# Patient Record
Sex: Female | Born: 1970 | Race: Black or African American | Hispanic: No | Smoking: Never smoker
Health system: Southern US, Community
[De-identification: ages and names within clinical notes are randomized; demographics above are authoritative.]

---

## 2014-03-07 HISTORY — PX: VAGINAL HYSTERECTOMY: SUR661

## 2020-10-01 ENCOUNTER — Other Ambulatory Visit (HOSPITAL_COMMUNITY)
Admission: RE | Admit: 2020-10-01 | Discharge: 2020-10-01 | Disposition: A | Discharge status: Home / Self Care | Payer: BC Managed Care – PPO | Source: Ambulatory Visit | Attending: Obstetrics | Admitting: Obstetrics

## 2020-10-01 ENCOUNTER — Ambulatory Visit (INDEPENDENT_AMBULATORY_CARE_PROVIDER_SITE_OTHER): Payer: BC Managed Care – PPO | Admitting: Obstetrics

## 2020-10-01 ENCOUNTER — Encounter: Payer: Self-pay | Admitting: Obstetrics

## 2020-10-01 ENCOUNTER — Other Ambulatory Visit: Payer: Self-pay

## 2020-10-01 VITALS — BP 152/96 | HR 73 | Ht 61.0 in | Wt 131.7 lb

## 2020-10-01 DIAGNOSIS — N898 Other specified noninflammatory disorders of vagina: Secondary | ICD-10-CM

## 2020-10-01 DIAGNOSIS — B373 Candidiasis of vulva and vagina: Secondary | ICD-10-CM | POA: Diagnosis not present

## 2020-10-01 DIAGNOSIS — B3731 Acute candidiasis of vulva and vagina: Secondary | ICD-10-CM

## 2020-10-01 DIAGNOSIS — Z01419 Encounter for gynecological examination (general) (routine) without abnormal findings: Secondary | ICD-10-CM

## 2020-10-01 DIAGNOSIS — Z780 Asymptomatic menopausal state: Secondary | ICD-10-CM

## 2020-10-01 MED ORDER — CLOTRIMAZOLE 1 % EX CREA: 1.0000 "application " | TOPICAL_CREAM | Freq: Two times a day (BID) | CUTANEOUS | 1 refills | Status: DC

## 2020-10-01 NOTE — Progress Notes (Signed)
Subjective:        Shannon Beck is a 50 y.o. female here for a routine exam.  Current complaints: Itching in right groin area and vaginal discharge..    Personal health questionnaire:  Is patient Shannon Beck, have a family history of breast and/or ovarian cancer: no Is there a family history of uterine cancer diagnosed at age < 70, gastrointestinal cancer, urinary tract cancer, family member who is a Personnel officer syndrome-associated carrier: no Is the patient overweight and hypertensive, family history of diabetes, personal history of gestational diabetes, preeclampsia or PCOS: no Is patient over 15, have PCOS,  family history of premature CHD under age 45, diabetes, smoke, have hypertension or peripheral artery disease:  no At any time, has a partner hit, kicked or otherwise hurt or frightened you?: no Over the past 2 weeks, have you felt down, depressed or hopeless?: no Over the past 2 weeks, have you felt little interest or pleasure in doing things?:no   Gynecologic History No LMP recorded. Patient is postmenopausal. Contraception: post menopausal status Last Pap: 2016. Results were: normal Last mammogram: 2022. Results were: normal  Obstetric History OB History  Gravida Para Term Preterm AB Living  3 3 3     3   SAB IAB Ectopic Multiple Live Births          3    # Outcome Date GA Lbr Len/2nd Weight Sex Delivery Anes PTL Lv  3 Term      Vag-Spont     2 Term      Vag-Spont     1 Term      Vag-Spont       History reviewed. No pertinent past medical history.  Past Surgical History:  Procedure Laterality Date   VAGINAL HYSTERECTOMY  2016   Has ovaries     Current Outpatient Medications:    Cholecalciferol (D3-1000) 25 MCG (1000 UT) capsule, , Disp: , Rfl:    clotrimazole (LOTRIMIN) 1 % cream, Apply 1 application topically 2 (two) times daily., Disp: 30 g, Rfl: 1   ferrous sulfate 325 (65 FE) MG tablet, Take by mouth., Disp: , Rfl:  No Known Allergies  Social History    Tobacco Use   Smoking status: Never   Smokeless tobacco: Never  Substance Use Topics   Alcohol use: Yes    Comment: 1-2x's year    Family History  Problem Relation Age of Onset   Hypertension Mother    Heart murmur Mother    Diabetes Father       Review of Systems  Constitutional: negative for fatigue and weight loss Respiratory: negative for cough and wheezing Cardiovascular: negative for chest pain, fatigue and palpitations Gastrointestinal: negative for abdominal pain and change in bowel habits Musculoskeletal:negative for myalgias Neurological: negative for gait problems and tremors Behavioral/Psych: negative for abusive relationship, depression Endocrine: negative for temperature intolerance    Genitourinary: positive for vaginal discharge and itching in right groin area.  negative for abnormal menstrual periods, genital lesions, hot flashes, sexual problems  Integument/breast: negative for breast lump, breast tenderness, nipple discharge and skin lesion(s)    Objective:       BP (!) 152/96   Pulse 73   Ht 5\' 1"  (1.549 m)   Wt 131 lb 11.2 oz (59.7 kg)   BMI 24.88 kg/m  General:   Alert and no distress  Skin:   no rash or abnormalities  Lungs:   clear to auscultation bilaterally  Heart:   regular rate and  rhythm, S1, S2 normal, no murmur, click, rub or gallop  Breasts:   normal without suspicious masses, skin or nipple changes or axillary nodes  Abdomen:  normal findings: no organomegaly, soft, non-tender and no hernia  Pelvis:  External genitalia: normal general appearance Urinary system: urethral meatus normal and bladder without fullness, nontender Vaginal: normal without tenderness, induration or masses Cervix: absent Adnexa: normal bimanual exam Uterus: absent   Lab Review Urine pregnancy test Labs reviewed yes Radiologic studies reviewed yes  I have spent a total of 20 minutes of face-to-face time, excluding clinical staff time, reviewing notes  and preparing to see patient, ordering tests and/or medications, and counseling the patient.   Assessment:   1. Encounter for annual routine gynecological examination  2. Menopause - doing well.  Occasional hot flashes.  3. Vaginal discharge Rx: - Cervicovaginal ancillary only  4. Candidal vulvitis Rx: - clotrimazole (LOTRIMIN) 1 % cream; Apply 1 application topically 2 (two) times daily.  Dispense: 30 g; Refill: 1     Plan:    Education reviewed: calcium supplements, depression evaluation, low fat, low cholesterol diet, safe sex/STD prevention, self breast exams, and weight bearing exercise. Follow up in: 1 year.   Meds ordered this encounter  Medications   clotrimazole (LOTRIMIN) 1 % cream    Sig: Apply 1 application topically 2 (two) times daily.    Dispense:  30 g    Refill:  1     Brock Bad, MD 10/01/2020 10:56 AM

## 2020-10-01 NOTE — Progress Notes (Signed)
NGYN patient presents for annual. Mammogram completed Jan 2022. Pt c/o outer vaginal discomfort for months.

## 2020-10-05 ENCOUNTER — Other Ambulatory Visit: Payer: Self-pay | Admitting: Obstetrics

## 2020-10-05 DIAGNOSIS — N760 Acute vaginitis: Secondary | ICD-10-CM

## 2020-10-05 LAB — CERVICOVAGINAL ANCILLARY ONLY
Bacterial Vaginitis (gardnerella): POSITIVE — AB
Candida Glabrata: NEGATIVE
Candida Vaginitis: NEGATIVE
Comment: NEGATIVE
Comment: NEGATIVE
Comment: NEGATIVE
Comment: NEGATIVE
Trichomonas: NEGATIVE

## 2020-10-05 MED ORDER — METRONIDAZOLE 500 MG PO TABS
500.0000 mg | ORAL_TABLET | Freq: Two times a day (BID) | ORAL | 2 refills | Status: DC
Start: 2020-10-05 — End: 2021-01-13

## 2020-10-06 ENCOUNTER — Telehealth: Payer: Self-pay

## 2020-10-06 NOTE — Telephone Encounter (Signed)
-----   Message from Brock Bad, MD sent at 10/05/2020  1:10 PM EDT ----- Flagyl Rx for BV

## 2020-10-06 NOTE — Telephone Encounter (Signed)
-----   Message from Charles A Harper, MD sent at 10/05/2020  1:10 PM EDT ----- Flagyl Rx for BV 

## 2020-10-06 NOTE — Telephone Encounter (Signed)
Patient reviewed test results online.RX has been called into pharmacy.

## 2021-01-13 ENCOUNTER — Other Ambulatory Visit: Payer: Self-pay

## 2021-01-13 ENCOUNTER — Encounter: Payer: Self-pay | Admitting: Obstetrics

## 2021-01-13 ENCOUNTER — Ambulatory Visit: Payer: BC Managed Care – PPO | Admitting: Obstetrics

## 2021-01-13 VITALS — BP 161/84 | HR 64 | Ht 61.0 in | Wt 129.9 lb

## 2021-01-13 DIAGNOSIS — Z780 Asymptomatic menopausal state: Secondary | ICD-10-CM

## 2021-01-13 DIAGNOSIS — Z01419 Encounter for gynecological examination (general) (routine) without abnormal findings: Secondary | ICD-10-CM

## 2021-01-13 DIAGNOSIS — N750 Cyst of Bartholin's gland: Secondary | ICD-10-CM

## 2021-01-13 MED ORDER — CLINDAMYCIN HCL 300 MG PO CAPS
300.0000 mg | ORAL_CAPSULE | Freq: Three times a day (TID) | ORAL | 0 refills | Status: DC
Start: 2021-01-13 — End: 2021-02-03

## 2021-01-13 NOTE — Progress Notes (Signed)
"  Cyst" in vaginal area, mildly painful, no vag discharge, had hysterectomy (ovaries only still present)

## 2021-01-13 NOTE — Progress Notes (Signed)
Patient ID: Shannon Beck, female   DOB: 09-29-1970, 50 y.o.   MRN: [id number]  No chief complaint on file.   HPI Shannon Beck is a 50 y.o. female.  Complains of " cyst " on outside of vagina that came up a week ago, nontender, small. HPI  No past medical history on file.  Past Surgical History:  Procedure Laterality Date   VAGINAL HYSTERECTOMY  2016   Has ovaries    Family History  Problem Relation Age of Onset   Hypertension Mother    Heart murmur Mother    Diabetes Father     Social History Social History   Tobacco Use   Smoking status: Never   Smokeless tobacco: Never  Substance Use Topics   Alcohol use: Yes    Comment: 1-2x's year   Drug use: Yes    No Known Allergies  Current Outpatient Medications  Medication Sig Dispense Refill   Cholecalciferol (D3-1000) 25 MCG (1000 UT) capsule      ferrous sulfate 325 (65 FE) MG tablet Take by mouth.     clotrimazole (LOTRIMIN) 1 % cream Apply 1 application topically 2 (two) times daily. 30 g 1   metroNIDAZOLE (FLAGYL) 500 MG tablet Take 1 tablet (500 mg total) by mouth 2 (two) times daily. 14 tablet 2   No current facility-administered medications for this visit.    Review of Systems Review of Systems Constitutional: negative for fatigue and weight loss Respiratory: negative for cough and wheezing Cardiovascular: negative for chest pain, fatigue and palpitations Gastrointestinal: negative for abdominal pain and change in bowel habits Genitourinary: positive for right Bartholin Cyst Integument/breast: negative for nipple discharge Musculoskeletal:negative for myalgias Neurological: negative for gait problems and tremors Behavioral/Psych: negative for abusive relationship, depression Endocrine: negative for temperature intolerance      Blood pressure (!) 161/84, pulse 64, height 5\' 1"  (1.549 m), weight 129 lb 14.4 oz (58.9 kg).  Physical Exam Physical Exam General:   Alert and no distress  Skin:   no rash  or abnormalities  Lungs:   clear to auscultation bilaterally  Heart:   regular rate and rhythm, S1, S2 normal, no murmur, click, rub or gallop  Breasts:   normal without suspicious masses, skin or nipple changes or axillary nodes  Abdomen:  normal findings: no organomegaly, soft, non-tender and no hernia  Pelvis:  External genitalia: Bartholin Cyst,ight, small, mild tenderness Urinary system: urethral meatus normal and bladder without fullness, nontender Vaginal: normal without tenderness, induration or masses Cervix: absent Adnexa: normal bimanual exam Uterus: absent      Data Reviewed Wet Prep  Assessment     1. Cyst of right Bartholin's gland, mildly tender, small Rx: - clindamycin (CLEOCIN) 300 MG capsule; Take 1 capsule (300 mg total) by mouth 3 (three) times daily.  Dispense: 21 capsule; Refill: 0  2. Menopause - clinically stable with occasional hot flashes     Plan   Follow up in 3 weeks   , MD 01/13/2021 4:16 PM

## 2021-02-03 ENCOUNTER — Telehealth (INDEPENDENT_AMBULATORY_CARE_PROVIDER_SITE_OTHER): Payer: BC Managed Care – PPO | Admitting: Obstetrics

## 2021-02-03 DIAGNOSIS — N750 Cyst of Bartholin's gland: Secondary | ICD-10-CM

## 2021-02-03 DIAGNOSIS — Z780 Asymptomatic menopausal state: Secondary | ICD-10-CM | POA: Diagnosis not present

## 2021-02-03 MED ORDER — AMOXICILLIN-POT CLAVULANATE 875-125 MG PO TABS: 1.0000 | ORAL_TABLET | Freq: Two times a day (BID) | ORAL | 0 refills | Status: DC

## 2021-02-03 NOTE — Progress Notes (Signed)
    GYNECOLOGY VIRTUAL VISIT ENCOUNTER NOTE  Provider location: Center for [facility]   Patient location: Home  I connected with Shannon Beck on 02/03/21 at  4:10 PM EST by MyChart Video Encounter and verified that I am speaking with the correct person using two identifiers.   I discussed the limitations, risks, security and privacy concerns of performing an evaluation and management service virtually and the availability of in person appointments. I also discussed with the patient that there may be a patient responsible charge related to this service. The patient expressed understanding and agreed to proceed.   History:  Shannon Beck is a 50 y.o. [id number] female being evaluated today for follow up after treatment of small, mildly symptomatic Bartholin Cyst. She denies any abnormal vaginal discharge, bleeding, pelvic pain or other concerns.       No past medical history on file. Past Surgical History:  Procedure Laterality Date   VAGINAL HYSTERECTOMY  2016   Has ovaries   The following portions of the patient's history were reviewed and updated as appropriate: allergies, current medications, past family history, past medical history, past social history, past surgical history and problem list.   Health Maintenance:  H/O a TVH.  Last pap smear in 2016, prior to hysterectomy.  Normal mammogram in 2022.   Review of Systems:  Pertinent items noted in HPI and remainder of comprehensive ROS otherwise negative.  Physical Exam:   General:  Alert, oriented and cooperative. Patient appears to be in no acute distress.  Mental Status: Normal mood and affect. Normal behavior. Normal judgment and thought content.   Respiratory: Normal respiratory effort, no problems with respiration noted  Rest of physical exam deferred due to type of encounter  Labs and Imaging  10/01/20 10:49  Trichomonas Negative  Candida Vaginitis Negative  Candida Glabrata Negative  Bacterial  Vaginitis (gardnerella) Positive !  CERVICOVAGINAL ANCILLARY ONLY Rpt !  !: Data is abnormal Rpt: View report in Results Review for more information    Assessment and Plan:     1. Cyst of right Bartholin's gland, resolving Rx: - amoxicillin-clavulanate (AUGMENTIN) 875-125 MG tablet; Take 1 tablet by mouth 2 (two) times daily. 1 tablet po BID x10 days  Dispense: 14 tablet; Refill: 0  2. Menopause - doing well clinically       I discussed the assessment and treatment plan with the patient. The patient was provided an opportunity to ask questions and all were answered. The patient agreed with the plan and demonstrated an understanding of the instructions.   The patient was advised to call back or seek an in-person evaluation/go to the ED if the symptoms worsen or if the condition fails to improve as anticipated.  I have spent a total of 15 minutes of non-face-to-face time, excluding clinical staff time, reviewing notes and preparing to see patient, ordering tests and/or medications, and counseling the patient.    Coral Ceo, MD Center for [facility], [facility]  02/03/21

## 2021-02-04 ENCOUNTER — Encounter: Payer: Self-pay | Admitting: Obstetrics

## 2021-02-25 ENCOUNTER — Encounter: Payer: Self-pay | Admitting: Obstetrics

## 2021-05-07 ENCOUNTER — Ambulatory Visit: Payer: BC Managed Care – PPO | Admitting: Obstetrics

## 2021-05-07 ENCOUNTER — Encounter: Payer: Self-pay | Admitting: Obstetrics

## 2021-05-07 ENCOUNTER — Other Ambulatory Visit: Payer: Self-pay

## 2021-05-07 VITALS — BP 127/73 | HR 77 | Ht 61.0 in | Wt 128.0 lb

## 2021-05-07 DIAGNOSIS — N750 Cyst of Bartholin's gland: Secondary | ICD-10-CM | POA: Diagnosis not present

## 2021-05-07 DIAGNOSIS — L738 Other specified follicular disorders: Secondary | ICD-10-CM

## 2021-05-07 NOTE — Progress Notes (Signed)
Patient ID: Shannon Beck, female   DOB: 1970-09-06, 51 y.o.   MRN: [id number] ? ?Chief Complaint  ?Patient presents with  ? Follow-up  ? ? ?HPI ?Shannon Beck is a 51 y.o. female.  Complains of painful area in right vulva area superior to the area of the Bartholin Cyst.  She states that that Bartholin Cyst has resolved. ?HPI ? ?History reviewed. No pertinent past medical history. ? ?Past Surgical History:  ?Procedure Laterality Date  ? VAGINAL HYSTERECTOMY  2016  ? Has ovaries  ? ? ?Family History  ?Problem Relation Age of Onset  ? Hypertension Mother   ? Heart murmur Mother   ? Diabetes Father   ? ? ?Social History ?Social History  ? ?Tobacco Use  ? Smoking status: Never  ? Smokeless tobacco: Never  ?Substance Use Topics  ? Alcohol use: Yes  ?  Comment: 1-2x's year  ? Drug use: Yes  ? ? ?No Known Allergies ? ?Current Outpatient Medications  ?Medication Sig Dispense Refill  ? amoxicillin-clavulanate (AUGMENTIN) 875-125 MG tablet Take 1 tablet by mouth 2 (two) times daily. 1 tablet po BID x10 days 14 tablet 0  ? Cholecalciferol (D3-1000) 25 MCG (1000 UT) capsule     ? clotrimazole (LOTRIMIN) 1 % cream Apply 1 application topically 2 (two) times daily. 30 g 1  ? ferrous sulfate 325 (65 FE) MG tablet Take by mouth.    ? ?No current facility-administered medications for this visit.  ? ? ?Review of Systems ?Review of Systems ?Constitutional: negative for fatigue and weight loss ?Respiratory: negative for cough and wheezing ?Cardiovascular: negative for chest pain, fatigue and palpitations ?Gastrointestinal: negative for abdominal pain and change in bowel habits ?Genitourinary: positive for right vulva pain ?Integument/breast: negative for nipple discharge ?Musculoskeletal:negative for myalgias ?Neurological: negative for gait problems and tremors ?Behavioral/Psych: negative for abusive relationship, depression ?Endocrine: negative for temperature intolerance    ?  ?Blood pressure 127/73, pulse 77, height 5\' 1"  (1.549  m), weight 128 lb (58.1 kg). ? ?Physical Exam ?Physical Exam ?General:   Alert and no distress  ?Skin:   no rash or abnormalities  ?Lungs:   clear to auscultation bilaterally  ?Heart:   regular rate and rhythm, S1, S2 normal, no murmur, click, rub or gallop  ?Breasts:   normal without suspicious masses, skin or nipple changes or axillary nodes  ?Abdomen:  normal findings: no organomegaly, soft, non-tender and no hernia  ?Pelvis:  External genitalia: normal general appearance ?Urinary system: urethral meatus normal and bladder without fullness, nontender ?Vaginal: normal without tenderness, induration or masses ?Cervix: normal appearance ?Adnexa: normal bimanual exam ?Uterus: anteverted and non-tender, normal size  ?  ?I have spent a total of 20 minutes of face-to-face time, excluding clinical staff time, reviewing notes and preparing to see patient, ordering tests and/or medications, and counseling the patient.  ? ?Data Reviewed ?Wet Prep ? ?Assessment  ?   ?1. Cyst of right Bartholin's gland, resolved ?- will follow clinically ? ?2. Folliculitis barbae ?- Triple Antibiotic ointment to area bid  ?  ? ?Plan ?  Follow up in 2 weeks ? ? ? , MD ?05/07/2021 4:37 PM  ?

## 2021-05-07 NOTE — Progress Notes (Signed)
51 y.o GYN presents for Follow up on Cyst.  ?

## 2021-05-25 ENCOUNTER — Telehealth: Payer: BC Managed Care – PPO | Admitting: Obstetrics

## 2021-07-13 NOTE — Progress Notes (Signed)
?Terrilee Files D.O. ? Sports Medicine ?[address] [place] [address] ?Phone: [phone] ?Subjective:   ?I, Nadine Counts, am serving as a Neurosurgeon for Dr. Antoine Primas. ?This visit occurred during the SARS-CoV-2 public health emergency.  Safety protocols were in place, including screening questions prior to the visit, additional usage of staff PPE, and extensive cleaning of exam room while observing appropriate contact time as indicated for disinfecting solutions.  ? ?I'm seeing this patient by the request  of:  Pcp, No ? ?CC: neck pain  ? ?VUD:THYHOOILNZ  ?Shannon Beck is a 51 y.o. female coming in with complaint of neck pain. Patient states neck pain is off and on. Started about 2 years ago. Only on the left side. Tingling down through elbow. Pain is sporadic, not certain motion produces pain. Sharp at times but discomfort most the time. ? ? ?  ? ?No past medical history on file. ?Past Surgical History:  ?Procedure Laterality Date  ? VAGINAL HYSTERECTOMY  2016  ? Has ovaries  ? ?Social History  ? ?Socioeconomic History  ? Marital status: Divorced  ?  Spouse name: Not on file  ? Number of children: Not on file  ? Years of education: Not on file  ? Highest education level: Not on file  ?Occupational History  ? Not on file  ?Tobacco Use  ? Smoking status: Never  ? Smokeless tobacco: Never  ?Substance and Sexual Activity  ? Alcohol use: Yes  ?  Comment: 1-2x's year  ? Drug use: Yes  ? Sexual activity: Yes  ?  Partners: Male  ?  Birth control/protection: Surgical  ?Other Topics Concern  ? Not on file  ?Social History Narrative  ? Not on file  ? ?Social Determinants of Health  ? ?Financial Resource Strain: Not on file  ?Food Insecurity: Not on file  ?Transportation Needs: Not on file  ?Physical Activity: Not on file  ?Stress: Not on file  ?Social Connections: Not on file  ? ?No Known Allergies ?Family History  ?Problem Relation Age of Onset  ? Hypertension Mother   ? Heart murmur Mother   ? Diabetes  Father   ? ? ? ? ? ? ?Current Outpatient Medications (Hematological):  ?  ferrous sulfate 325 (65 FE) MG tablet, Take by mouth. ? ?Current Outpatient Medications (Other):  ?  gabapentin (NEURONTIN) 100 MG capsule, Take 1 capsule (100 mg total) by mouth at bedtime. ?  amoxicillin-clavulanate (AUGMENTIN) 875-125 MG tablet, Take 1 tablet by mouth 2 (two) times daily. 1 tablet po BID x10 days ?  Cholecalciferol (D3-1000) 25 MCG (1000 UT) capsule,  ?  clotrimazole (LOTRIMIN) 1 % cream, Apply 1 application topically 2 (two) times daily. ? ? ?Reviewed prior external information including notes and imaging from  ?primary care provider ?As well as notes that were available from care everywhere and other healthcare systems. ? ?Past medical history, social, surgical and family history all reviewed in electronic medical record.  No pertanent information unless stated regarding to the chief complaint.  ? ?Review of Systems: ? No headache, visual changes, nausea, vomiting, diarrhea, constipation, dizziness, abdominal pain, skin rash, fevers, chills, night sweats, weight loss, swollen lymph nodes, body aches, joint swelling, chest pain, shortness of breath, mood changes. POSITIVE muscle aches ? ?Objective  ?Blood pressure (!) 138/92, pulse 75, height 5\' 1"  (1.549 m), weight 130 lb (59 kg), SpO2 99 %. ?  ?General: No apparent distress alert and oriented x3 mood and affect normal, dressed appropriately.  ?HEENT:  Pupils equal, extraocular movements intact  ?Respiratory: Patient's speak in full sentences and does not appear short of breath  ?Cardiovascular: No lower extremity edema, non tender, no erythema  ?Gait normal with good balance and coordination.  ?MSK: Neck exam does have some very mild loss of lordosis.  Patient does have more discomfort with extension of the neck but no true radicular symptoms with Spurling's.  Patient does have weakness noted in the C8 distribution when compared to the contralateral side. ?Patient does  have some mild scapular dyskinesis noted on the left side with inferior winging noted. ? ?Osteopathic findings ? ?C4 flexed rotated and side bent left ?C7 flexed rotated and side bent left ?T3 extended rotated and side bent left inhaled third rib ? ? ?97110; 15 additional minutes spent for Therapeutic exercises as stated in above notes.  This included exercises focusing on stretching, strengthening, with significant focus on eccentric aspects.   Long term goals include an improvement in range of motion, strength, endurance as well as avoiding reinjury. Patient's frequency would include in 1-2 times a day, 3-5 times a week for a duration of 6-12 weeks. Exercises that included:  ?Basic scapular stabilization to include adduction and depression of scapula ?Scaption, focusing on proper movement and good control ?Internal and External rotation utilizing a theraband, with elbow tucked at side entire time ?Rows with theraband  ? Proper technique shown and discussed handout in great detail with ATC.  All questions were discussed and answered.  ? ?  ?Impression and Recommendations:  ?  ?The above documentation has been reviewed and is accurate and complete Judi Saa, DO ? ? ? ?

## 2021-07-14 ENCOUNTER — Ambulatory Visit (INDEPENDENT_AMBULATORY_CARE_PROVIDER_SITE_OTHER): Payer: BC Managed Care – PPO

## 2021-07-14 ENCOUNTER — Ambulatory Visit: Payer: BC Managed Care – PPO | Admitting: Family Medicine

## 2021-07-14 VITALS — BP 138/92 | HR 75 | Ht 61.0 in | Wt 130.0 lb

## 2021-07-14 DIAGNOSIS — M9902 Segmental and somatic dysfunction of thoracic region: Secondary | ICD-10-CM

## 2021-07-14 DIAGNOSIS — G2589 Other specified extrapyramidal and movement disorders: Secondary | ICD-10-CM | POA: Diagnosis not present

## 2021-07-14 DIAGNOSIS — M9901 Segmental and somatic dysfunction of cervical region: Secondary | ICD-10-CM

## 2021-07-14 DIAGNOSIS — M542 Cervicalgia: Secondary | ICD-10-CM

## 2021-07-14 DIAGNOSIS — M9908 Segmental and somatic dysfunction of rib cage: Secondary | ICD-10-CM

## 2021-07-14 MED ORDER — GABAPENTIN 100 MG PO CAPS: 100.0000 mg | ORAL_CAPSULE | Freq: Every day | ORAL | 0 refills | Status: AC

## 2021-07-14 NOTE — Patient Instructions (Addendum)
Gabapentin 100mg  prescibed ?Think of shoulder down and back ?Do prescribed exercises at least 3x a week ?Xrays today ?See you again in 5-6 weeks ?

## 2021-07-14 NOTE — Assessment & Plan Note (Signed)
   Decision today to treat with OMT was based on Physical Exam  After verbal consent patient was treated with HVLA, ME, FPR techniques in cervical, thoracic, rib,areas, all areas are chronic   Patient tolerated the procedure well with improvement in symptoms  Patient given exercises, stretches and lifestyle modifications  See medications in patient instructions if given  Patient will follow up in 4-8 weeks 

## 2021-07-14 NOTE — Assessment & Plan Note (Signed)
Patient does have what appears to be more muscle imbalances, poor ergonomics, as well as scapular dyskinesis.  The patient did have some weakness noted in the C8 distribution that is somewhat concerning.  We discussed very low-dose gabapentin and taking that at night.  Patient did respond well to osteopathic manipulation.  Discussed icing regimen and home exercises.  Increase activity slowly.  We will follow-up with me again in 6 to 8 weeks otherwise. ?

## 2021-08-17 NOTE — Progress Notes (Unsigned)
  [facility] [address] [place] [address] Phone: [phone] Subjective:    [name], am serving as a scribe for Dr. Antoine Primas. 2 CC: Neck and back pain follow-up  [id number]  Shannon Beck is a 51 y.o. female coming in with complaint of back and neck pain.  Seems to have more of a scapular dyskinesis and given home exercises.  OMT 07/14/2021.  Patient states that she has been doing well since last visit. Has been working through exercises and using gabapentin prn.   Medications patient has been prescribed: Gabapentin  Taking:         Reviewed prior external information including notes and imaging from previsou exam, outside providers and external EMR if available.   As well as notes that were available from care everywhere and other healthcare systems.  Past medical history, social, surgical and family history all reviewed in electronic medical record.  No pertanent information unless stated regarding to the chief complaint.   No past medical history on file.  No Known Allergies   Review of Systems:  No headache, visual changes, nausea, vomiting, diarrhea, constipation, dizziness, abdominal pain, skin rash, fevers, chills, night sweats, weight loss, swollen lymph nodes, body aches, joint swelling, chest pain, shortness of breath, mood changes. POSITIVE muscle aches but improving  Objective  Blood pressure 110/82, pulse 72, height 5\' 1"  (1.549 m), weight 130 lb (59 kg), SpO2 99 %.   General: No apparent distress alert and oriented x3 mood and affect normal, dressed appropriately.  HEENT: Pupils equal, extraocular movements intact  Respiratory: Patient's speak in full sentences and does not appear short of breath  Cardiovascular: No lower extremity edema, non tender, no erythema  Gait normal MSK:  Back does have some scapular dyskinesis right greater than left with inferior winging noted.  Patient has still tightness  noted in the neck but improvement noted.  Tightness with the last 5 degrees of extension. Low back also has more tightness in the thoracolumbar juncture  Osteopathic findings  C2 flexed rotated and side bent right C6 flexed rotated and side bent left T3 extended rotated and side bent right inhaled rib T11 extended rotated and side bent right L1 flexed rotated and side bent left Sacrum right on right       Assessment and Plan:  Scapular dyskinesis Discussed with patient still having some scapular dyskinesis.  Discussed the strengthening aspect.  Does have the gabapentin which she has been taking occasionally.  We will continue to monitor.  Follow-up with me again in 8 to 12 weeks    Nonallopathic problems  Decision today to treat with OMT was based on Physical Exam  After verbal consent patient was treated with HVLA, ME, FPR techniques in cervical, rib, thoracic, lumbar, and sacral  areas  Patient tolerated the procedure well with improvement in symptoms  Patient given exercises, stretches and lifestyle modifications  See medications in patient instructions if given  Patient will follow up in 4-8 weeks     The above documentation has been reviewed and is accurate and complete , DO         Note: This dictation was prepared with Dragon dictation along with smaller phrase technology. Any transcriptional errors that result from this process are unintentional.

## 2021-08-18 ENCOUNTER — Ambulatory Visit: Payer: BC Managed Care – PPO | Admitting: Family Medicine

## 2021-08-18 VITALS — BP 110/82 | HR 72 | Ht 61.0 in | Wt 130.0 lb

## 2021-08-18 DIAGNOSIS — M9904 Segmental and somatic dysfunction of sacral region: Secondary | ICD-10-CM | POA: Diagnosis not present

## 2021-08-18 DIAGNOSIS — G2589 Other specified extrapyramidal and movement disorders: Secondary | ICD-10-CM

## 2021-08-18 DIAGNOSIS — M9902 Segmental and somatic dysfunction of thoracic region: Secondary | ICD-10-CM

## 2021-08-18 DIAGNOSIS — M9908 Segmental and somatic dysfunction of rib cage: Secondary | ICD-10-CM | POA: Diagnosis not present

## 2021-08-18 DIAGNOSIS — M9903 Segmental and somatic dysfunction of lumbar region: Secondary | ICD-10-CM | POA: Diagnosis not present

## 2021-08-18 DIAGNOSIS — M9901 Segmental and somatic dysfunction of cervical region: Secondary | ICD-10-CM | POA: Diagnosis not present

## 2021-08-18 NOTE — Assessment & Plan Note (Signed)
Discussed with patient still having some scapular dyskinesis.  Discussed the strengthening aspect.  Does have the gabapentin which she has been taking occasionally.  We will continue to monitor.  Follow-up with me again in 8 to 12 weeks

## 2021-08-18 NOTE — Patient Instructions (Signed)
Good to see you! Consider a vertical mouse Keep up the exercises you're doing great See you again in 2-3 months

## 2021-11-17 NOTE — Progress Notes (Signed)
  [facility] [address] [place] [address] Phone: [phone] Subjective:   [name], am serving as a scribe for Dr. Antoine Primas.  I'm seeing this patient by the request  of:  Pcp, No  CC: Back and neck pain follow-up  [id number]  Aicia Babinski is a 51 y.o. female coming in with complaint of back and neck pain. OMT on 08/18/2021. Patient states that she has been doing well. Using gabapentin prn and feels OMT helps her maintain less pain in spine.   Medications patient has been prescribed: Gabapentin  Taking:         Reviewed prior external information including notes and imaging from previsou exam, outside providers and external EMR if available.   As well as notes that were available from care everywhere and other healthcare systems.  Past medical history, social, surgical and family history all reviewed in electronic medical record.  No pertanent information unless stated regarding to the chief complaint.      Review of Systems:  No headache, visual changes, nausea, vomiting, diarrhea, constipation, dizziness, abdominal pain, skin rash, fevers, chills, night sweats, weight loss, swollen lymph nodes, body aches, joint swelling, chest pain, shortness of breath, mood changes. POSITIVE muscle aches  Objective  Blood pressure 128/84, pulse 64, height 5\' 1"  (1.549 m), weight 132 lb (59.9 kg), SpO2 98 %.   General: No apparent distress alert and oriented x3 mood and affect normal, dressed appropriately.  HEENT: Pupils equal, extraocular movements intact  Respiratory: Patient's speak in full sentences and does not appear short of breath  Cardiovascular: No lower extremity edema, non tender, no erythema  Gait MSK:  Back   Osteopathic findings  C2 flexed rotated and side bent right C6 flexed rotated and side bent left T3 extended rotated and side bent left inhaled rib L3 flexed rotated and side bent right Sacrum right on  right       Assessment and Plan:  Scapular dyskinesis Patient does have some scapular dyskinesis still noted.  We discussed posture and.  Responded extremely well to osteopathic manipulation.  Was found to have significant tightness though noted in the right sacroiliac joint and hip.  We will get x-rays to further evaluate this.  Follow-up with me again in 6 to 8 weeks.  Right hip pain Does have some limited range of motion noted.  Does have some tenderness to palpation in the paraspinal musculature.  Patient will have x-rays to further evaluate.  Did have significant difficulties with the sacroiliac joint.    Nonallopathic problems  Decision today to treat with OMT was based on Physical Exam  After verbal consent patient was treated with HVLA, ME, FPR techniques in cervical, rib, thoracic, lumbar, and sacral  areas  Patient tolerated the procedure well with improvement in symptoms  Patient given exercises, stretches and lifestyle modifications  See medications in patient instructions if given  Patient will follow up in 4-8 weeks     The above documentation has been reviewed and is accurate and complete , DO         Note: This dictation was prepared with Dragon dictation along with smaller phrase technology. Any transcriptional errors that result from this process are unintentional.

## 2021-11-18 ENCOUNTER — Ambulatory Visit (INDEPENDENT_AMBULATORY_CARE_PROVIDER_SITE_OTHER): Payer: BC Managed Care – PPO

## 2021-11-18 ENCOUNTER — Ambulatory Visit: Payer: BC Managed Care – PPO | Admitting: Family Medicine

## 2021-11-18 VITALS — BP 128/84 | HR 64 | Ht 61.0 in | Wt 132.0 lb

## 2021-11-18 DIAGNOSIS — M9903 Segmental and somatic dysfunction of lumbar region: Secondary | ICD-10-CM | POA: Diagnosis not present

## 2021-11-18 DIAGNOSIS — M25551 Pain in right hip: Secondary | ICD-10-CM | POA: Diagnosis not present

## 2021-11-18 DIAGNOSIS — M9904 Segmental and somatic dysfunction of sacral region: Secondary | ICD-10-CM | POA: Diagnosis not present

## 2021-11-18 DIAGNOSIS — M9901 Segmental and somatic dysfunction of cervical region: Secondary | ICD-10-CM

## 2021-11-18 DIAGNOSIS — G2589 Other specified extrapyramidal and movement disorders: Secondary | ICD-10-CM | POA: Diagnosis not present

## 2021-11-18 DIAGNOSIS — M9908 Segmental and somatic dysfunction of rib cage: Secondary | ICD-10-CM

## 2021-11-18 DIAGNOSIS — M9902 Segmental and somatic dysfunction of thoracic region: Secondary | ICD-10-CM

## 2021-11-18 NOTE — Assessment & Plan Note (Signed)
Does have some limited range of motion noted.  Does have some tenderness to palpation in the paraspinal musculature.  Patient will have x-rays to further evaluate.  Did have significant difficulties with the sacroiliac joint.

## 2021-11-18 NOTE — Assessment & Plan Note (Signed)
Patient does have some scapular dyskinesis still noted.  We discussed posture and.  Responded extremely well to osteopathic manipulation.  Was found to have significant tightness though noted in the right sacroiliac joint and hip.  We will get x-rays to further evaluate this.  Follow-up with me again in 6 to 8 weeks.

## 2021-11-18 NOTE — Patient Instructions (Signed)
Hip abductor strength Xray today See me again in 6-8 weeks

## 2022-01-19 NOTE — Progress Notes (Unsigned)
  Shannon Beck [address] [place] [address] Phone: [phone] Subjective:   Shannon Beck, am serving as a scribe for Dr. Antoine Primas.  I'm seeing this patient by the request  of:  Pcp, No  CC: Back and hip and neck pain follow-up  [id number]  Shannon Beck is a 51 y.o. female coming in with complaint of back and neck pain. OMT 11/18/2021 and R hip pain f/u.  Patient states that she is improving. Pain moves from hip to back each day. Using gabapentin prn.   Medications patient has been prescribed: Gabapentin  Taking:  Xray R hip 11/18/2021 IMPRESSION: Mild degenerative changes of both hips, right slightly worse than left.       Reviewed prior external information including notes and imaging from previsou exam, outside providers and external EMR if available.   As well as notes that were available from care everywhere and other healthcare systems.  Past medical history, social, surgical and family history all reviewed in electronic medical record.  No pertanent information unless stated regarding to the chief complaint.   No past medical history on file.  No Known Allergies   Review of Systems:  No headache, visual changes, nausea, vomiting, diarrhea, constipation, dizziness, abdominal pain, skin rash, fevers, chills, night sweats, weight loss, swollen lymph nodes, body aches, joint swelling, chest pain, shortness of breath, mood changes. POSITIVE muscle aches  Objective  Blood pressure 128/88, pulse 78, height 5\' 1"  (1.549 m), weight 134 lb (60.8 kg), SpO2 97 %.   General: No apparent distress alert and oriented x3 mood and affect normal, dressed appropriately.  HEENT: Pupils equal, extraocular movements intact  Respiratory: Patient's speak in full sentences and does not appear short of breath  Cardiovascular: No lower extremity edema, non tender, no erythema  Neck exam does have some mild loss of lordosis.  Patient does  have still the scapular dyskinesis noted.  Patient does have some tightness with sidebending of the neck bilaterally.  Osteopathic findings  C2 flexed rotated and side bent right C6 flexed rotated and side bent left T3 extended rotated and side bent right inhaled rib T9 extended rotated and side bent left L2 flexed rotated and side bent right Sacrum right on right       Assessment and Plan:  Scapular dyskinesis Scapular dyskinesis noted.  Discussed which activities to do and which ones to avoid.  Discussed posture and ergonomics.  Follow-up again in 6 to 8 weeks patient has not taken any medications.  Responds well to osteopathic manipulation.  Follow-up again in 2 to 3 months for evaluation and treatment.    Nonallopathic problems  Decision today to treat with OMT was based on Physical Exam  After verbal consent patient was treated with HVLA, ME, FPR techniques in cervical, rib, thoracic, lumbar, and sacral  areas  Patient tolerated the procedure well with improvement in symptoms  Patient given exercises, stretches and lifestyle modifications  See medications in patient instructions if given  Patient will follow up in 4-8 weeks     The above documentation has been reviewed and is accurate and complete , DO         Note: This dictation was prepared with Dragon dictation along with smaller phrase technology. Any transcriptional errors that result from this process are unintentional.

## 2022-01-20 ENCOUNTER — Encounter: Payer: Self-pay | Admitting: Family Medicine

## 2022-01-20 ENCOUNTER — Ambulatory Visit: Payer: BC Managed Care – PPO | Admitting: Family Medicine

## 2022-01-20 VITALS — BP 128/88 | HR 78 | Ht 61.0 in | Wt 134.0 lb

## 2022-01-20 DIAGNOSIS — M9903 Segmental and somatic dysfunction of lumbar region: Secondary | ICD-10-CM | POA: Diagnosis not present

## 2022-01-20 DIAGNOSIS — M9901 Segmental and somatic dysfunction of cervical region: Secondary | ICD-10-CM

## 2022-01-20 DIAGNOSIS — M9902 Segmental and somatic dysfunction of thoracic region: Secondary | ICD-10-CM

## 2022-01-20 DIAGNOSIS — G2589 Other specified extrapyramidal and movement disorders: Secondary | ICD-10-CM | POA: Diagnosis not present

## 2022-01-20 DIAGNOSIS — M9908 Segmental and somatic dysfunction of rib cage: Secondary | ICD-10-CM

## 2022-01-20 DIAGNOSIS — M9904 Segmental and somatic dysfunction of sacral region: Secondary | ICD-10-CM | POA: Diagnosis not present

## 2022-01-20 NOTE — Patient Instructions (Signed)
Doing great When having a flare IBU 2pills 2x a day for 2 days Then get off of IBU Ice is a good idea Get yourself a massge See me in 3 months

## 2022-01-20 NOTE — Assessment & Plan Note (Signed)
Scapular dyskinesis noted.  Discussed which activities to do and which ones to avoid.  Discussed posture and ergonomics.  Follow-up again in 6 to 8 weeks patient has not taken any medications.  Responds well to osteopathic manipulation.  Follow-up again in 2 to 3 months for evaluation and treatment.

## 2022-04-15 NOTE — Progress Notes (Unsigned)
  Shannon Beck [address] [place] Phone: [phone] Subjective:   IVilma Meckel, am serving as a scribe for Dr. Hulan Saas.  I'm seeing this patient by the request  of:  Pcp, No  CC: Low back pain  [id number]  Aurilla Coulibaly is a 52 y.o. female coming in with complaint of back and neck pain. OMT 01/20/2022. Patient states doing well. Same per usual.  He did not do the exercises as frequently over the holidays.  Medications patient has been prescribed: None  Taking:         Reviewed prior external information including notes and imaging from previsou exam, outside providers and external EMR if available.   As well as notes that were available from care everywhere and other healthcare systems.  Past medical history, social, surgical and family history all reviewed in electronic medical record.  No pertanent information unless stated regarding to the chief complaint.   No past medical history on file.  No Known Allergies   Review of Systems:  No headache, visual changes, nausea, vomiting, diarrhea, constipation, dizziness, abdominal pain, skin rash, fevers, chills, night sweats, weight loss, swollen lymph nodes, body aches, joint swelling, chest pain, shortness of breath, mood changes. POSITIVE muscle aches  Objective  Blood pressure 120/80, pulse 81, height 5\' 1"  (1.549 m), weight 131 lb (59.4 kg), SpO2 97 %.   General: No apparent distress alert and oriented x3 mood and affect normal, dressed appropriately.  HEENT: Pupils equal, extraocular movements intact  Respiratory: Patient's speak in full sentences and does not appear short of breath  Cardiovascular: No lower extremity edema, non tender, no erythema  Neck exam does some loss lordosis.  Some limited sidebending bilaterally.  Tightness noted with FABER test right greater than left.  Osteopathic findings  C2 flexed rotated and side bent right C6 flexed rotated  and side bent left T3 extended rotated and side bent right inhaled rib T7 extended rotated and side bent left        Assessment and Plan:  Scapular dyskinesis Patient is making good strides, did get some good range of motion noted today.  Discussed icing regimen and home exercises, which activities to do and which ones to avoid, increase activity slowly.  I discussed the posture and ergonomics that I think will be more beneficial.  Follow-up with me again in 2 to 3 months    Nonallopathic problems  Decision today to treat with OMT was based on Physical Exam  After verbal consent patient was treated with HVLA, ME, FPR techniques in cervical, rib, thoracic,areas  Patient tolerated the procedure well with improvement in symptoms  Patient given exercises, stretches and lifestyle modifications  See medications in patient instructions if given  Patient will follow up in 4-8 weeks    The above documentation has been reviewed and is accurate and complete Lyndal Pulley, DO          Note: This dictation was prepared with Dragon dictation along with smaller phrase technology. Any transcriptional errors that result from this process are unintentional.

## 2022-04-18 ENCOUNTER — Ambulatory Visit: Payer: BC Managed Care – PPO | Admitting: Family Medicine

## 2022-04-18 VITALS — BP 120/80 | HR 81 | Ht 61.0 in | Wt 131.0 lb

## 2022-04-18 DIAGNOSIS — M9901 Segmental and somatic dysfunction of cervical region: Secondary | ICD-10-CM

## 2022-04-18 DIAGNOSIS — M9902 Segmental and somatic dysfunction of thoracic region: Secondary | ICD-10-CM | POA: Diagnosis not present

## 2022-04-18 DIAGNOSIS — M9908 Segmental and somatic dysfunction of rib cage: Secondary | ICD-10-CM

## 2022-04-18 DIAGNOSIS — G2589 Other specified extrapyramidal and movement disorders: Secondary | ICD-10-CM | POA: Diagnosis not present

## 2022-04-18 NOTE — Assessment & Plan Note (Signed)
Patient is making good strides, did get some good range of motion noted today.  Discussed icing regimen and home exercises, which activities to do and which ones to avoid, increase activity slowly.  I discussed the posture and ergonomics that I think will be more beneficial.  Follow-up with me again in 2 to 3 months

## 2022-04-18 NOTE — Patient Instructions (Signed)
Good to see you See me again in 3 months 

## 2022-07-26 NOTE — Progress Notes (Unsigned)
  [facility] [address] [place] [address] Phone: [phone] Subjective:   [name], am serving as a scribe for Dr. Antoine Primas.  I'm seeing this patient by the request  of:  Pcp, No  CC: Back and neck pain follow-up  [id number]  Shannon Beck is a 52 y.o. female coming in with complaint of back and neck pain. OMT 04/18/2022. Patient states that her back pain has increased since last visit but it isn't as bad as her initial visit. Manipulation is helpful.   Medications patient has been prescribed: None         Reviewed prior external information including notes and imaging from previsou exam, outside providers and external EMR if available.   As well as notes that were available from care everywhere and other healthcare systems.  Past medical history, social, surgical and family history all reviewed in electronic medical record.  No pertanent information unless stated regarding to the chief complaint.   No past medical history on file.  No Known Allergies   Review of Systems:  No headache, visual changes, nausea, vomiting, diarrhea, constipation, dizziness, abdominal pain, skin rash, fevers, chills, night sweats, weight loss, swollen lymph nodes, body aches, joint swelling, chest pain, shortness of breath, mood changes. POSITIVE muscle aches  Objective  Blood pressure (!) 122/92, pulse 60, height 5\' 1"  (1.549 m), weight 130 lb (59 kg), SpO2 98 %.   General: No apparent distress alert and oriented x3 mood and affect normal, dressed appropriately.  HEENT: Pupils equal, extraocular movements intact  Respiratory: Patient's speak in full sentences and does not appear short of breath  Cardiovascular: No lower extremity edema, non tender, no erythema  Patient does have tightness noted in the paraspinal musculature right greater than left.  Patient does have some limited sidebending noted.  Still some mild impingement noted of the  right shoulder.  Osteopathic findings  C2 flexed rotated and side bent right C7 flexed rotated and side bent left T3 extended rotated and side bent right inhaled rib T9 extended rotated and side bent left L2 flexed rotated and side bent right Sacrum right on right    Assessment and Plan:  Scapular dyskinesis Still believe the patient has more of a scapular dyskinesis.  If continuing to have difficulty with the shoulder I do feel that an ultrasound may be necessary.  Discussed posture and ergonomics otherwise.  Discussed which activities to do and which ones to avoid.  Increase activity slowly over the course of next several weeks.  Patient will follow-up with me again in 6 to 8 weeks    Nonallopathic problems  Decision today to treat with OMT was based on Physical Exam  After verbal consent patient was treated with HVLA, ME, FPR techniques in cervical, rib, thoracic, lumbar, and sacral  areas  Patient tolerated the procedure well with improvement in symptoms  Patient given exercises, stretches and lifestyle modifications  See medications in patient instructions if given  Patient will follow up in 4-8 weeks     The above documentation has been reviewed and is accurate and complete Judi Saa, DO         Note: This dictation was prepared with Dragon dictation along with smaller phrase technology. Any transcriptional errors that result from this process are unintentional.

## 2022-07-27 ENCOUNTER — Encounter: Payer: Self-pay | Admitting: Family Medicine

## 2022-07-27 ENCOUNTER — Ambulatory Visit: Payer: Federal, State, Local not specified - PPO | Admitting: Family Medicine

## 2022-07-27 VITALS — BP 122/92 | HR 60 | Ht 61.0 in | Wt 130.0 lb

## 2022-07-27 DIAGNOSIS — M9908 Segmental and somatic dysfunction of rib cage: Secondary | ICD-10-CM

## 2022-07-27 DIAGNOSIS — G2589 Other specified extrapyramidal and movement disorders: Secondary | ICD-10-CM | POA: Diagnosis not present

## 2022-07-27 DIAGNOSIS — M9904 Segmental and somatic dysfunction of sacral region: Secondary | ICD-10-CM

## 2022-07-27 DIAGNOSIS — M9903 Segmental and somatic dysfunction of lumbar region: Secondary | ICD-10-CM | POA: Diagnosis not present

## 2022-07-27 DIAGNOSIS — M9901 Segmental and somatic dysfunction of cervical region: Secondary | ICD-10-CM

## 2022-07-27 DIAGNOSIS — M9902 Segmental and somatic dysfunction of thoracic region: Secondary | ICD-10-CM

## 2022-07-27 NOTE — Patient Instructions (Signed)
Move keyboard and mouse to the R  Keep screen at eye level Try standing desk Stay active See me again in 7-8 weeks

## 2022-07-27 NOTE — Assessment & Plan Note (Signed)
Still believe the patient has more of a scapular dyskinesis.  If continuing to have difficulty with the shoulder I do feel that an ultrasound may be necessary.  Discussed posture and ergonomics otherwise.  Discussed which activities to do and which ones to avoid.  Increase activity slowly over the course of next several weeks.  Patient will follow-up with me again in 6 to 8 weeks

## 2022-09-20 NOTE — Progress Notes (Unsigned)
Shannon Beck [address] [place] [address] Phone: [phone] Subjective:   Shannon Beck, am serving as a scribe for Shannon Beck.  I'm seeing this patient by the request  of:  Pcp, No  CC: right hip pain follow up   [id number]  Shannon Beck is a 52 y.o. female coming in with complaint of back and neck pain. OMT 07/27/2022. Also f/u for R hip pain.  Patient states same per usual. No new concerns.  Patient has been able to be active.  Some mild increase in neck pain though secondary to posture at work.  Has been having a desk where the monitor is a little higher than where it supposed to be.  Medications patient has been prescribed: None  Taking:         Reviewed prior external information including notes and imaging from previsou exam, outside providers and external EMR if available.   As well as notes that were available from care everywhere and other healthcare systems.  Past medical history, social, surgical and family history all reviewed in electronic medical record.  No pertanent information unless stated regarding to the chief complaint.   No past medical history on file.  No Known Allergies   Review of Systems:  No headache, visual changes, nausea, vomiting, diarrhea, constipation, dizziness, abdominal pain, skin rash, fevers, chills, night sweats, weight loss, swollen lymph nodes, body aches, joint swelling, chest pain, shortness of breath, mood changes. POSITIVE muscle aches  Objective  Blood pressure 134/88, pulse 93, height 5\' 1"  (1.549 m), weight 128 lb (58.1 kg), SpO2 97%.   General: No apparent distress alert and oriented x3 mood and affect normal, dressed appropriately.  HEENT: Pupils equal, extraocular movements intact  Respiratory: Patient's speak in full sentences and does not appear short of breath  Cardiovascular: No lower extremity edema, non tender, no erythema  Neck exam does have some increase in  tightness especially at the occipital region on the right as well as in the parascapular area bilaterally but right greater than left.  Osteopathic findings  C2 flexed rotated and side bent right C6 flexed rotated and side bent left T3 extended rotated and side bent right inhaled rib T9 extended rotated and side bent left L2 flexed rotated and side bent right  Sacrum right on right       Assessment and Plan:  Scapular dyskinesis Very mild exacerbation secondary to patient's work environment where she has been on a different desk has caused some poor ergonomics.  Patient states that she feels like she can make improvement at the moment.  Discussed icing regimen and home exercises, discussed which activities to do and which ones to avoid.  Increase activity slowly over the course of next several weeks.  Follow-up with me again in 4 months.    Nonallopathic problems  Decision today to treat with OMT was based on Physical Exam  After verbal consent patient was treated with HVLA, ME, FPR techniques in cervical, rib, thoracic, lumbar, and sacral  areas  Patient tolerated the procedure well with improvement in symptoms  Patient given exercises, stretches and lifestyle modifications  See medications in patient instructions if given  Patient will follow up in 4-8 weeks     The above documentation has been reviewed and is accurate and complete Shannon Saa, DO         Note: This dictation was prepared with Dragon dictation along with smaller phrase technology. Any transcriptional errors  that result from this process are unintentional.

## 2022-09-21 ENCOUNTER — Ambulatory Visit: Payer: Federal, State, Local not specified - PPO | Admitting: Family Medicine

## 2022-09-21 ENCOUNTER — Encounter: Payer: Self-pay | Admitting: Family Medicine

## 2022-09-21 VITALS — BP 134/88 | HR 93 | Ht 61.0 in | Wt 128.0 lb

## 2022-09-21 DIAGNOSIS — M9903 Segmental and somatic dysfunction of lumbar region: Secondary | ICD-10-CM | POA: Diagnosis not present

## 2022-09-21 DIAGNOSIS — M9902 Segmental and somatic dysfunction of thoracic region: Secondary | ICD-10-CM | POA: Diagnosis not present

## 2022-09-21 DIAGNOSIS — M9901 Segmental and somatic dysfunction of cervical region: Secondary | ICD-10-CM

## 2022-09-21 DIAGNOSIS — M9908 Segmental and somatic dysfunction of rib cage: Secondary | ICD-10-CM

## 2022-09-21 DIAGNOSIS — G2589 Other specified extrapyramidal and movement disorders: Secondary | ICD-10-CM | POA: Diagnosis not present

## 2022-09-21 DIAGNOSIS — M9904 Segmental and somatic dysfunction of sacral region: Secondary | ICD-10-CM

## 2022-09-21 NOTE — Patient Instructions (Signed)
Get that booster chair Stay active See you again in 4 months

## 2022-09-21 NOTE — Assessment & Plan Note (Addendum)
Very mild exacerbation secondary to patient's work environment where she has been on a different desk has caused some poor ergonomics.  Patient states that she feels like she can make improvement at the moment.  Discussed icing regimen and home exercises, discussed which activities to do and which ones to avoid.  Increase activity slowly over the course of next several weeks.  Follow-up with me again in 4 months.  Patient has tolerated gabapentin and it does work for her breakthrough pain.

## 2023-01-19 NOTE — Progress Notes (Signed)
  [facility] [address] [place] [address] Phone: [phone] Subjective:   Shannon Beck, am serving as a scribe for Dr. Antoine Primas.  I'm seeing this patient by the request  of:  Pcp, No  CC: Back and neck pain follow-up  [id number]  Shannon Beck is a 52 y.o. female coming in with complaint of back and neck pain. OMT 09/21/2022. Patient states that she has been doing well.   Medications patient has been prescribed: None  Taking:         Reviewed prior external information including notes and imaging from previsou exam, outside providers and external EMR if available.   As well as notes that were available from care everywhere and other healthcare systems.  Past medical history, social, surgical and family history all reviewed in electronic medical record.  No pertanent information unless stated regarding to the chief complaint.   No past medical history on file.  No Known Allergies   Review of Systems:  No headache, visual changes, nausea, vomiting, diarrhea, constipation, dizziness, abdominal pain, skin rash, fevers, chills, night sweats, weight loss, swollen lymph nodes, body aches, joint swelling, chest pain, shortness of breath, mood changes. POSITIVE muscle aches  Objective  Blood pressure (!) 122/90, pulse 70, height 5\' 1"  (1.549 m), weight 132 lb (59.9 kg), SpO2 96%.   General: No apparent distress alert and oriented x3 mood and affect normal, dressed appropriately.  HEENT: Pupils equal, extraocular movements intact  Respiratory: Patient's speak in full sentences and does not appear short of breath  Cardiovascular: No lower extremity edema, non tender, no erythema  Back exam does have some loss lordosis noted.  Some tenderness to palpation in the paraspinal musculature.  Tightness noted in the right paraspinal musculature.  Osteopathic findings  C2 flexed rotated and side bent right C6 flexed rotated and side  bent left T3 extended rotated and side bent right inhaled rib T9 extended rotated and side bent left L2 flexed rotated and side bent right Sacrum right on right       Assessment and Plan:  Scapular dyskinesis Scapular dyskinesis.  Has done well with this previously.  Having more right hip pain that seems to be more secondary to sacroiliitis.  Will need to continue to monitor.  Given some new exercises to help with hip abductor strengthening.  Worsening pain do feel advanced imaging is warranted but will get a lumbar x-ray today.  Will follow-up again in 6 to 8 weeks    Nonallopathic problems  Decision today to treat with OMT was based on Physical Exam  After verbal consent patient was treated with HVLA, ME, FPR techniques in cervical, rib, thoracic, lumbar, and sacral  areas  Patient tolerated the procedure well with improvement in symptoms  Patient given exercises, stretches and lifestyle modifications  See medications in patient instructions if given  Patient will follow up in 4-8 weeks     The above documentation has been reviewed and is accurate and complete Judi Saa, DO         Note: This dictation was prepared with Dragon dictation along with smaller phrase technology. Any transcriptional errors that result from this process are unintentional.

## 2023-01-24 ENCOUNTER — Encounter: Payer: Self-pay | Admitting: Family Medicine

## 2023-01-24 ENCOUNTER — Ambulatory Visit (INDEPENDENT_AMBULATORY_CARE_PROVIDER_SITE_OTHER): Payer: Federal, State, Local not specified - PPO | Admitting: Family Medicine

## 2023-01-24 ENCOUNTER — Ambulatory Visit (INDEPENDENT_AMBULATORY_CARE_PROVIDER_SITE_OTHER): Payer: Federal, State, Local not specified - PPO

## 2023-01-24 VITALS — BP 122/90 | HR 70 | Ht 61.0 in | Wt 132.0 lb

## 2023-01-24 DIAGNOSIS — M9908 Segmental and somatic dysfunction of rib cage: Secondary | ICD-10-CM

## 2023-01-24 DIAGNOSIS — M545 Low back pain, unspecified: Secondary | ICD-10-CM | POA: Diagnosis not present

## 2023-01-24 DIAGNOSIS — M9902 Segmental and somatic dysfunction of thoracic region: Secondary | ICD-10-CM

## 2023-01-24 DIAGNOSIS — G2589 Other specified extrapyramidal and movement disorders: Secondary | ICD-10-CM | POA: Diagnosis not present

## 2023-01-24 DIAGNOSIS — M9901 Segmental and somatic dysfunction of cervical region: Secondary | ICD-10-CM | POA: Diagnosis not present

## 2023-01-24 DIAGNOSIS — M9904 Segmental and somatic dysfunction of sacral region: Secondary | ICD-10-CM | POA: Diagnosis not present

## 2023-01-24 DIAGNOSIS — M25551 Pain in right hip: Secondary | ICD-10-CM | POA: Diagnosis not present

## 2023-01-24 DIAGNOSIS — M9903 Segmental and somatic dysfunction of lumbar region: Secondary | ICD-10-CM

## 2023-01-24 NOTE — Assessment & Plan Note (Signed)
Scapular dyskinesis.  Has done well with this previously.  Having more right hip pain that seems to be more secondary to sacroiliitis.  Will need to continue to monitor.  Given some new exercises to help with hip abductor strengthening.  Worsening pain do feel advanced imaging is warranted but will get a lumbar x-ray today.  Will follow-up again in 6 to 8 weeks

## 2023-01-24 NOTE — Patient Instructions (Signed)
Exercises Xray today See me in 6-8 weeks

## 2023-03-23 NOTE — Progress Notes (Signed)
  [facility] [address] [place] [address] Phone: [phone] Subjective:   Shannon Beck, am serving as a scribe for Dr. Antoine Primas.  I'm seeing this patient by the request  of:  Pcp, No  CC: Back and neck pain  [id number]  Shannon Beck is a 53 y.o. female coming in with complaint of back and neck pain. OMT 01/24/2023. Patient states no new concerns. Same per usual.  Has some discomfort still in the back of the neck.  Feels that food could be potentially contributing as well.  Medications patient has been prescribed: None  Taking:         Reviewed prior external information including notes and imaging from previsou exam, outside providers and external EMR if available.   As well as notes that were available from care everywhere and other healthcare systems.  Past medical history, social, surgical and family history all reviewed in electronic medical record.  No pertanent information unless stated regarding to the chief complaint.   No past medical history on file.  No Known Allergies   Review of Systems:  No headache, visual changes, nausea, vomiting, diarrhea, constipation, dizziness, abdominal pain, skin rash, fevers, chills, night sweats, weight loss, swollen lymph nodes, body aches, joint swelling, chest pain, shortness of breath, mood changes. POSITIVE muscle aches  Objective  Blood pressure 124/84, pulse 80, height 5\' 1"  (1.549 m), weight 132 lb (59.9 kg), SpO2 96%.   General: No apparent distress alert and oriented x3 mood and affect normal, dressed appropriately.  HEENT: Pupils equal, extraocular movements intact  Respiratory: Patient's speak in full sentences and does not appear short of breath  Cardiovascular: No lower extremity edema, non tender, no erythema  MSK:  Back still show scapular dyskinesis noted.  Tightness noted of the neck with sidebending bilaterally.  Right hip still has some discomfort even to  palpation.  Osteopathic findings  C2 flexed rotated and side bent right C7 flexed rotated and side bent left T3 extended rotated and side bent right inhaled rib T7 extended rotated and side bent left L2 flexed rotated and side bent right Sacrum right on right     Assessment and Plan:  No problem-specific Assessment & Plan notes found for this encounter.    Nonallopathic problems  Decision today to treat with OMT was based on Physical Exam  After verbal consent patient was treated with HVLA, ME, FPR techniques in cervical, rib, thoracic, lumbar, and sacral  areas  Patient tolerated the procedure well with improvement in symptoms  Patient given exercises, stretches and lifestyle modifications  See medications in patient instructions if given  Patient will follow up in 4-8 weeks    The above documentation has been reviewed and is accurate and complete Judi Saa, DO          Note: This dictation was prepared with Dragon dictation along with smaller phrase technology. Any transcriptional errors that result from this process are unintentional.

## 2023-03-28 ENCOUNTER — Encounter: Payer: Self-pay | Admitting: Family Medicine

## 2023-03-28 ENCOUNTER — Ambulatory Visit (INDEPENDENT_AMBULATORY_CARE_PROVIDER_SITE_OTHER): Payer: Commercial Managed Care - PPO | Admitting: Family Medicine

## 2023-03-28 VITALS — BP 124/84 | HR 80 | Ht 61.0 in | Wt 132.0 lb

## 2023-03-28 DIAGNOSIS — M9903 Segmental and somatic dysfunction of lumbar region: Secondary | ICD-10-CM | POA: Diagnosis not present

## 2023-03-28 DIAGNOSIS — M9902 Segmental and somatic dysfunction of thoracic region: Secondary | ICD-10-CM

## 2023-03-28 DIAGNOSIS — G2589 Other specified extrapyramidal and movement disorders: Secondary | ICD-10-CM

## 2023-03-28 DIAGNOSIS — M9904 Segmental and somatic dysfunction of sacral region: Secondary | ICD-10-CM

## 2023-03-28 DIAGNOSIS — M9901 Segmental and somatic dysfunction of cervical region: Secondary | ICD-10-CM | POA: Diagnosis not present

## 2023-03-28 DIAGNOSIS — M9908 Segmental and somatic dysfunction of rib cage: Secondary | ICD-10-CM

## 2023-03-28 NOTE — Assessment & Plan Note (Signed)
Continues to have scapular dyskinesis and right leg pain.  Continues to have tightness more of the lower back than what we have seen previously.  We discussed ergonomics, posture, discussed the piriformis exercises.  Patient is going to work on this a little bit more.  Increase activity slowly.  Follow-up again in 6 to 8 weeks.

## 2023-03-28 NOTE — Patient Instructions (Addendum)
Good to see you! [place] Just use gravity to help with stretching See you again in 6-8 weeks

## 2023-05-12 NOTE — Progress Notes (Signed)
  [facility] [address] [place] [address] Phone: [phone] Subjective:   Shannon Beck, am serving as a scribe for Dr. Antoine Primas.  I'm seeing this patient by the request  of:  Pcp, No  CC: Back and neck pain follow-up  [id number]  Shannon Beck is a 53 y.o. female coming in with complaint of back and neck pain. OMT 03/28/2023. Patient states that she is the same as last visit. Pain over R GT.   Medications patient has been prescribed: None       Reviewing outside labs patient did have a calcium level of 10.3 in November.   Reviewed prior external information including notes and imaging from previsou exam, outside providers and external EMR if available.   As well as notes that were available from care everywhere and other healthcare systems.  Past medical history, social, surgical and family history all reviewed in electronic medical record.  No pertanent information unless stated regarding to the chief complaint.    Review of Systems:  No headache, visual changes, nausea, vomiting, diarrhea, constipation, dizziness, abdominal pain, skin rash, fevers, chills, night sweats, weight loss, swollen lymph nodes, body aches, joint swelling, chest pain, shortness of breath, mood changes. POSITIVE muscle aches  Objective  Blood pressure 112/86, pulse 78, height 5\' 1"  (1.549 m), weight 123 lb (55.8 kg), SpO2 100%.   General: No apparent distress alert and oriented x3 mood and affect normal, dressed appropriately.  HEENT: Pupils equal, extraocular movements intact  Respiratory: Patient's speak in full sentences and does not appear short of breath  Cardiovascular: No lower extremity edema, non tender, no erythema  Gait relatively normal MSK:  Back does have some loss lordosis noted.  Tightness noted in the paraspinal musculature.  Negative straight leg test noted today.  Osteopathic findings  C6 flexed rotated and side bent  left T3 extended rotated and side bent right inhaled rib T8 extended rotated and side bent left L2 flexed rotated and side bent right L3 flexed rotated and side bent left Sacrum right on right       Assessment and Plan:  Scapular dyskinesis Scapular dyskinesis still noted.  Will continue to monitor.  Discussed icing regimen and home exercises.  Discussed which activities to do and which ones to avoid.  Increase activity slowly over the course the next several weeks.  Discussed posture dynamics.  Discussed short course of ibuprofen 600 mg up to 3 times a day for 3 days.  Follow-up again in 6 to 8 weeks otherwise    Nonallopathic problems  Decision today to treat with OMT was based on Physical Exam  After verbal consent patient was treated with HVLA, ME, FPR techniques in cervical, rib, thoracic, lumbar, and sacral  areas  Patient tolerated the procedure well with improvement in symptoms  Patient given exercises, stretches and lifestyle modifications  See medications in patient instructions if given  Patient will follow up in 4-8 weeks    The above documentation has been reviewed and is accurate and complete Judi Saa, DO          Note: This dictation was prepared with Dragon dictation along with smaller phrase technology. Any transcriptional errors that result from this process are unintentional.

## 2023-05-16 ENCOUNTER — Ambulatory Visit: Payer: Commercial Managed Care - PPO | Admitting: Family Medicine

## 2023-05-16 ENCOUNTER — Encounter: Payer: Self-pay | Admitting: Family Medicine

## 2023-05-16 VITALS — BP 112/86 | HR 78 | Ht 61.0 in | Wt 123.0 lb

## 2023-05-16 DIAGNOSIS — M9901 Segmental and somatic dysfunction of cervical region: Secondary | ICD-10-CM | POA: Diagnosis not present

## 2023-05-16 DIAGNOSIS — M9908 Segmental and somatic dysfunction of rib cage: Secondary | ICD-10-CM

## 2023-05-16 DIAGNOSIS — M9902 Segmental and somatic dysfunction of thoracic region: Secondary | ICD-10-CM | POA: Diagnosis not present

## 2023-05-16 DIAGNOSIS — M9903 Segmental and somatic dysfunction of lumbar region: Secondary | ICD-10-CM | POA: Diagnosis not present

## 2023-05-16 DIAGNOSIS — G2589 Other specified extrapyramidal and movement disorders: Secondary | ICD-10-CM | POA: Diagnosis not present

## 2023-05-16 DIAGNOSIS — M9904 Segmental and somatic dysfunction of sacral region: Secondary | ICD-10-CM

## 2023-05-16 NOTE — Assessment & Plan Note (Signed)
 Scapular dyskinesis still noted.  Will continue to monitor.  Discussed icing regimen and home exercises.  Discussed which activities to do and which ones to avoid.  Increase activity slowly over the course the next several weeks.  Discussed posture dynamics.  Discussed short course of ibuprofen 600 mg up to 3 times a day for 3 days.  Follow-up again in 6 to 8 weeks otherwise

## 2023-05-16 NOTE — Patient Instructions (Signed)
 Looks like other docs have labs in Have fun with the yard 3 IBU today See me in 7-8 weeks

## 2023-07-01 IMAGING — DX DG CERVICAL SPINE 2 OR 3 VIEWS
4 series · 4 of 4 positions shown · non-contrast
Comparison: None Available.

CLINICAL DATA: Intermittent neck pain, left side, x 2 years.
Tingling down to elbow.

EXAM:
CERVICAL SPINE - 2-3 VIEW

[c-spine lat]
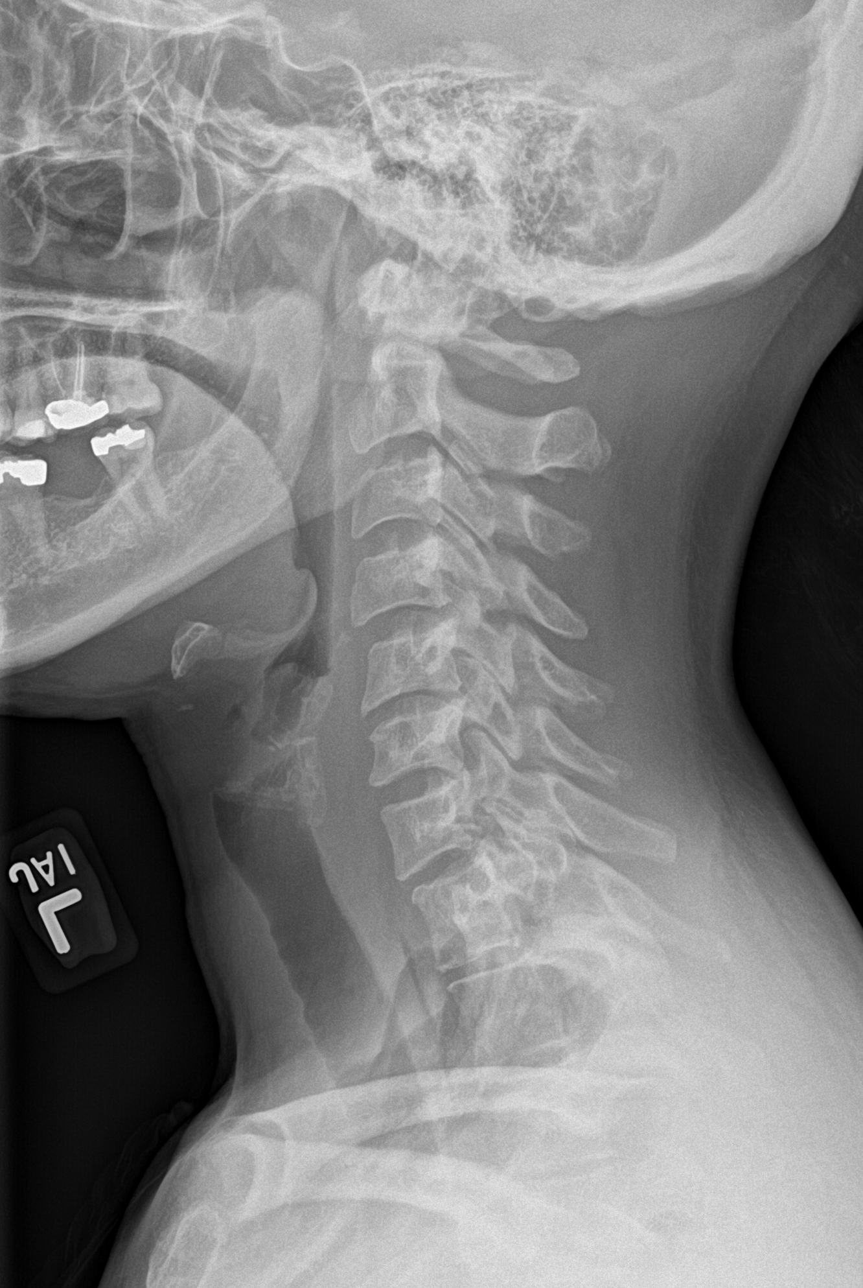

[c-spine ap]
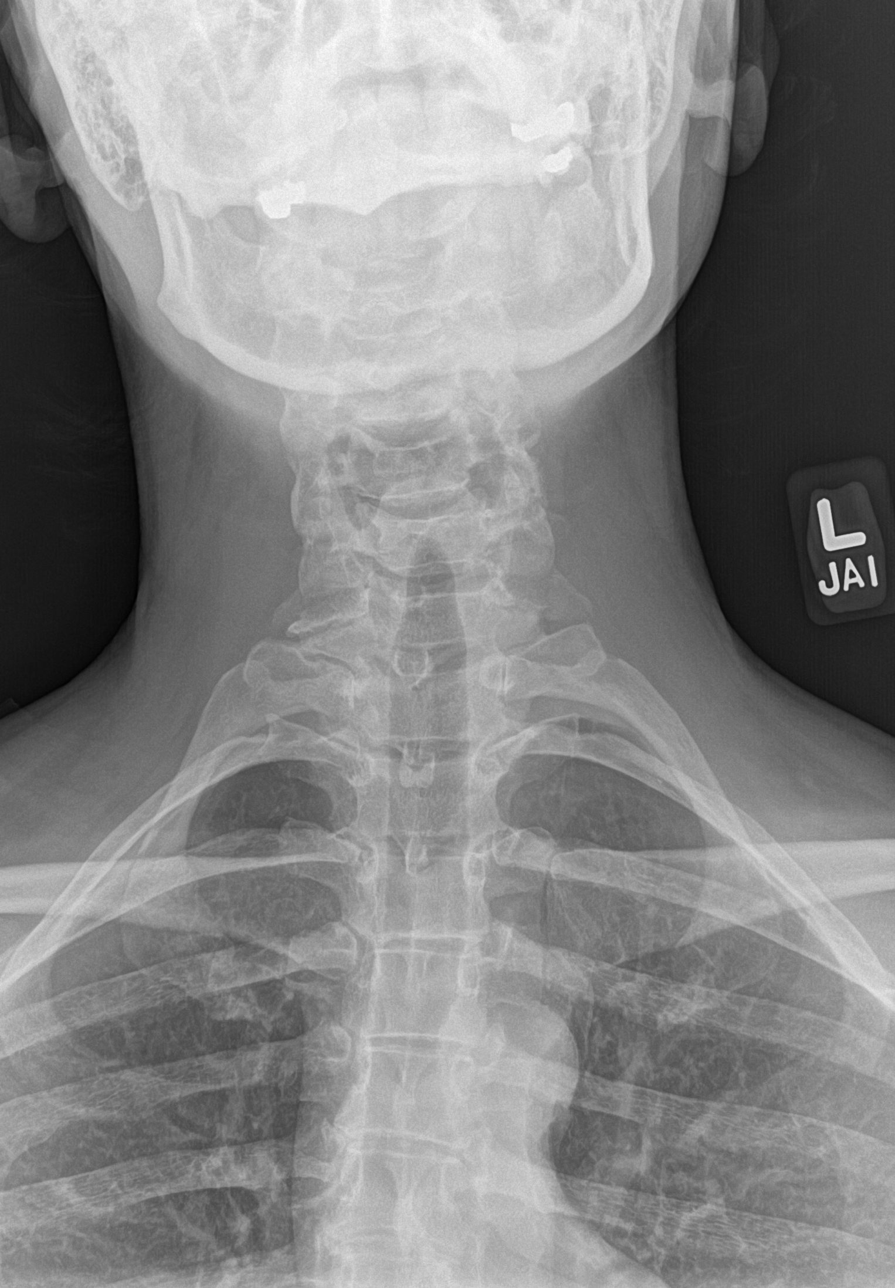

[c-spine open mouth (1 of 2)]
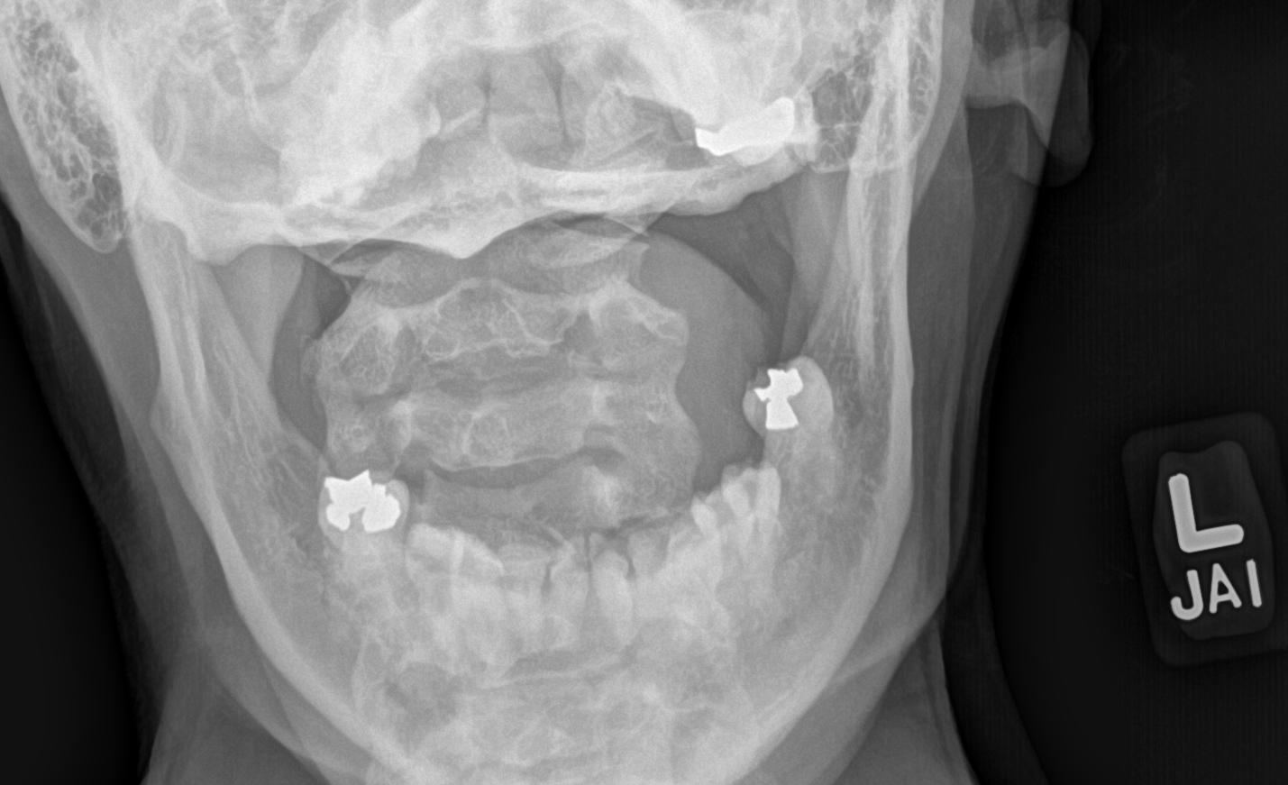

[c-spine open mouth (2 of 2)]
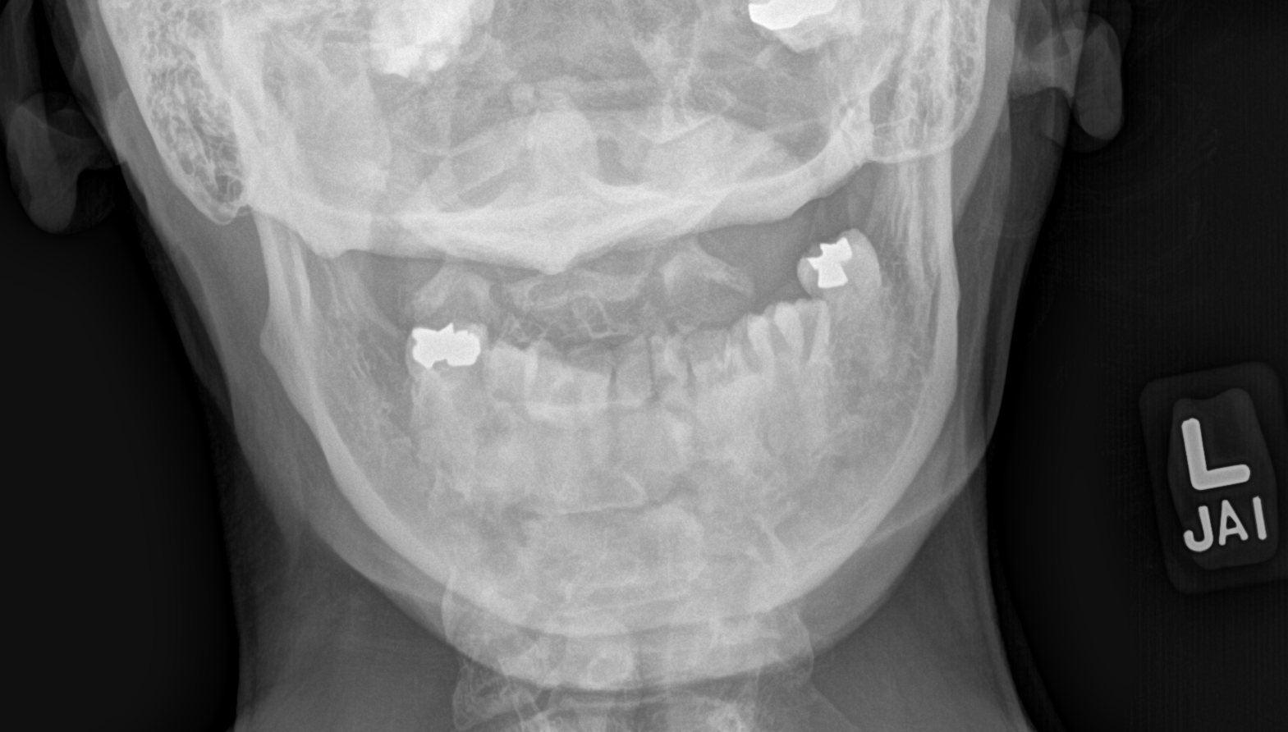

[4 of 4 positions shown; findings below may reference images not displayed]

FINDINGS: Straightening of the cervical spine. Mild disc space narrowing
C5-C6. Vertebral body heights are maintained. Dens and lateral
masses are within normal limits
IMPRESSION: Minimal degenerative change at C5-C6

## 2023-08-22 NOTE — Progress Notes (Signed)
 [facility] [address] [place] [address] Phone: [phone] Subjective:   Shannon Beck, am serving as a scribe for Dr. Ronnell Coins.  I'm seeing this patient by the request  of:  Myra Aschoff, PA-C  CC: Back and neck pain follow-up  [id number]  Shannon Beck is a 53 y.o. female coming in with complaint of back and neck pain. OMT 05/16/2023. Patient states continues to have discomfort and pain.  Nothing that stopping her from activity.  Does state that she has had some more difficulty with sleep but this has been a problem she has had previously.  Sometimes more stiffness.  Still able to work in the yard and do at least 1-1/2 to 2 miles of walking daily.  Medications patient has been prescribed: None  Taking:         Reviewed prior external information including notes and imaging from previsou exam, outside providers and external EMR if available.   As well as notes that were available from care everywhere and other healthcare systems.  Past medical history, social, surgical and family history all reviewed in electronic medical record.  No pertanent information unless stated regarding to the chief complaint.   No past medical history on file.  No Known Allergies   Review of Systems:  No headache, visual changes, nausea, vomiting, diarrhea, constipation, dizziness, abdominal pain, skin rash, fevers, chills, night sweats, weight loss, swollen lymph nodes, body aches, joint swelling, chest pain, shortness of breath, mood changes. POSITIVE muscle aches  Objective  Blood pressure 110/78, pulse 69, height 5' 1 (1.549 m), weight 123 lb (55.8 kg), SpO2 99%.   General: No apparent distress alert and oriented x3 mood and affect normal, dressed appropriately.  HEENT: Pupils equal, extraocular movements intact  Respiratory: Patient's speak in full sentences and does not appear short of breath  Cardiovascular: No lower extremity  edema, non tender, no erythema  Tightness in the neck noted.  Some tenderness to palpation Foley.  Osteopathic findings  C2 flexed rotated and side bent right C7 flexed rotated and side bent left T3 extended rotated and side bent right inhaled rib T7 extended rotated and side bent left L2 flexed rotated and side bent right Sacrum right on right       Assessment and Plan:  Scapular dyskinesis Some mild increase in tightness noted.  Patient is doing much better than where she started this whole endeavor.  Discussed with patient though that there is a chance that patient is not sleeping on a regular basis.  Do think that this is causing some difficulty as well.  Discussed icing regimen of home exercises.  Discussed which activities to do and which ones to avoid.  Increase activity slowly.  Trazodone given for some insomnia.  Follow-up again in 6 to 8 weeks.    Nonallopathic problems  Decision today to treat with OMT was based on Physical Exam  After verbal consent patient was treated with HVLA, ME, FPR techniques in cervical, rib, thoracic, lumbar, and sacral  areas  Patient tolerated the procedure well with improvement in symptoms  Patient given exercises, stretches and lifestyle modifications  See medications in patient instructions if given  Patient will follow up in 4-8 weeks    The above documentation has been reviewed and is accurate and complete Shannon Beck M Shannon Brunsman, DO          Note: This dictation was prepared with Dragon dictation along with smaller phrase technology. Any transcriptional  errors that result from this process are unintentional.

## 2023-08-24 ENCOUNTER — Encounter: Payer: Self-pay | Admitting: Family Medicine

## 2023-08-24 ENCOUNTER — Ambulatory Visit: Admitting: Family Medicine

## 2023-08-24 VITALS — BP 110/78 | HR 69 | Ht 61.0 in | Wt 123.0 lb

## 2023-08-24 DIAGNOSIS — M9904 Segmental and somatic dysfunction of sacral region: Secondary | ICD-10-CM | POA: Diagnosis not present

## 2023-08-24 DIAGNOSIS — M9903 Segmental and somatic dysfunction of lumbar region: Secondary | ICD-10-CM | POA: Diagnosis not present

## 2023-08-24 DIAGNOSIS — G2589 Other specified extrapyramidal and movement disorders: Secondary | ICD-10-CM

## 2023-08-24 DIAGNOSIS — M9902 Segmental and somatic dysfunction of thoracic region: Secondary | ICD-10-CM

## 2023-08-24 DIAGNOSIS — G4700 Insomnia, unspecified: Secondary | ICD-10-CM | POA: Insufficient documentation

## 2023-08-24 DIAGNOSIS — M9901 Segmental and somatic dysfunction of cervical region: Secondary | ICD-10-CM | POA: Diagnosis not present

## 2023-08-24 DIAGNOSIS — M9908 Segmental and somatic dysfunction of rib cage: Secondary | ICD-10-CM

## 2023-08-24 DIAGNOSIS — F5101 Primary insomnia: Secondary | ICD-10-CM

## 2023-08-24 MED ORDER — TRAZODONE HCL 50 MG PO TABS: 25.0000 mg | ORAL_TABLET | Freq: Every evening | ORAL | 3 refills | Status: AC | PRN

## 2023-08-24 NOTE — Patient Instructions (Addendum)
 Trazadone at bedtime See me in 3 months

## 2023-08-24 NOTE — Assessment & Plan Note (Signed)
 Prescribe trazodone 50 mg nightly to see how patient would respond and use it as needed

## 2023-08-24 NOTE — Assessment & Plan Note (Signed)
 Some mild increase in tightness noted.  Patient is doing much better than where she started this whole endeavor.  Discussed with patient though that there is a chance that patient is not sleeping on a regular basis.  Do think that this is causing some difficulty as well.  Discussed icing regimen of home exercises.  Discussed which activities to do and which ones to avoid.  Increase activity slowly.  Trazodone given for some insomnia.  Follow-up again in 6 to 8 weeks.

## 2023-11-23 NOTE — Progress Notes (Signed)
  [facility] [address] [place] [address] Phone: [phone] Subjective:   Shannon Beck, am serving as a scribe for Dr. Arthea Claudene.  I'm seeing this patient by the request  of:  Claudene Delon CROME, PA-C  CC: neck and backpain follow up   [id number]  Shannon Beck is a 53 y.o. female coming in with complaint of back and neck pain. OMT 08/24/2023. Patient states that for one week she has had R sided groin pain. Woke up in pain. Denies any radiating symptoms.   Medications patient has been prescribed: Trazadone  Taking:         Reviewed prior external information including notes and imaging from previsou exam, outside providers and external EMR if available.   As well as notes that were available from care everywhere and other healthcare systems.  Past medical history, social, surgical and family history all reviewed in electronic medical record.  No pertanent information unless stated regarding to the chief complaint.   No past medical history on file.  No Known Allergies   Review of Systems:  No headache, visual changes, nausea, vomiting, diarrhea, constipation, dizziness, abdominal pain, skin rash, fevers, chills, night sweats, weight loss, swollen lymph nodes, body aches, joint swelling, chest pain, shortness of breath, mood changes. POSITIVE muscle aches  Objective  There were no vitals taken for this visit.   General: No apparent distress alert and oriented x3 mood and affect normal, dressed appropriately.  HEENT: Pupils equal, extraocular movements intact  Respiratory: Patient's speak in full sentences and does not appear short of breath  Cardiovascular: No lower extremity edema, non tender, no erythema  Gait MSK:  Back and does have some loss lordosis noted.  Some tenderness to palpation in the right paraspinal musculature. Right hip does have some tenderness to palpation noted.  Patient has more pain with external  rotation and internal rotation.  Negative grind test noted.  Negative fulcrum test noted.  Some tightness noted in the adductor.  [id number]; 15 additional minutes spent for Therapeutic exercises as stated in above notes.  This included exercises focusing on stretching, strengthening, with significant focus on eccentric aspects.   Long term goals include an improvement in range of motion, strength, endurance as well as avoiding reinjury. Patient's frequency would include in 1-2 times a day, 3-5 times a week for a duration of 6-12 weeks. Hip strengthening exercises which included:  Pelvic tilt/bracing to help with proper recruitment of the lower abs and pelvic floor muscles  Glute strengthening to properly contract glutes without over-engaging low back and hamstrings - prone hip extension and glute bridge exercises Proper stretching techniques to increase effectiveness for the hip flexors, groin, quads, piriformic and low back when appropriate   Proper technique shown and discussed handout in great detail with ATC.  All questions were discussed and answered.       Assessment and Plan:  No problem-specific Assessment & Plan notes found for this encounter.       The above documentation has been reviewed and is accurate and complete Hisao Doo M Omari Mcmanaway, DO          Note: This dictation was prepared with Dragon dictation along with smaller phrase technology. Any transcriptional errors that result from this process are unintentional.

## 2023-11-27 ENCOUNTER — Ambulatory Visit: Admitting: Family Medicine

## 2023-11-27 ENCOUNTER — Encounter: Payer: Self-pay | Admitting: Family Medicine

## 2023-11-27 VITALS — BP 110/82 | HR 65 | Ht 61.0 in | Wt 128.0 lb

## 2023-11-27 DIAGNOSIS — M25551 Pain in right hip: Secondary | ICD-10-CM

## 2023-11-27 MED ORDER — MELOXICAM 7.5 MG PO TABS: 7.5000 mg | ORAL_TABLET | Freq: Every day | ORAL | 0 refills | Status: AC

## 2023-11-27 NOTE — Patient Instructions (Signed)
 Adductor Thigh compression sleeve Ice after activity Meloxicam  7.5mg  daily for 5 days See me in 2 months

## 2023-11-27 NOTE — Assessment & Plan Note (Signed)
 Recurrent exacerbation of the right hip pain.  Patient is having a augmented walking at the moment.  Discussed icing regimen, discussed about compression sleeve, given new exercises as well.  I am concerned that there is a potential for some labral pathology and if continuing to have difficulty advanced imaging would be warranted.  Previous x-rays were reviewed today showing only mild arthritic changes.  Follow-up again in 2 weeks.  Meloxicam  7.5 mg given.

## 2024-01-22 ENCOUNTER — Ambulatory Visit: Admitting: Family Medicine

## 2024-03-20 NOTE — Progress Notes (Signed)
 " Shannon Beck [address] [place] [address] Phone: [phone] Subjective:   Shannon Beck, am serving as a scribe for Dr. Arthea Claudene.  I'm seeing this patient by the request  of:  Claudene Delon LITTIE Beck  CC: Right hip pain  YEP:Dlagzrupcz  11/27/2023 Recurrent exacerbation of the right hip pain.  Patient is having a augmented walking at the moment.  Discussed icing regimen, discussed about compression sleeve, given new exercises as well.  I am concerned that there is a potential for some labral pathology and if continuing to have difficulty advanced imaging would be warranted.  Previous x-rays were reviewed today showing only mild arthritic changes.  Follow-up again in 2 weeks.  Meloxicam  7.5 mg given.      Update 03/26/2024 Shannon Beck is a 54 y.o. female coming in with complaint of R hip pain.  Some concern for labral pathology and started on meloxicam .  Was to do home exercises and icing regimen.  Patient states has been doing pretty decent. No new symptoms.      No past medical history on file. Past Surgical History:  Procedure Laterality Date   VAGINAL HYSTERECTOMY  2016   Has ovaries   Social History   Socioeconomic History   Marital status: Divorced    Spouse name: Not on file   Number of children: Not on file   Years of education: Not on file   Highest education level: Not on file  Occupational History   Not on file  Tobacco Use   Smoking status: Never   Smokeless tobacco: Never  Substance and Sexual Activity   Alcohol use: Yes    Comment: 1-2x's year   Drug use: Yes   Sexual activity: Yes    Partners: Male    Birth control/protection: Surgical  Other Topics Concern   Not on file  Social History Narrative   Not on file   Social Drivers of Health   Tobacco Use: Low Risk (01/22/2024)   Received from [facility]   Patient History    Smoking Tobacco Use: Never    Smokeless Tobacco Use: Never    Passive  Exposure: Not on file  Financial Resource Strain: Low Risk (01/24/2023)   Received from [facility]   Overall Financial Resource Strain (CARDIA)    Difficulty of Paying Living Expenses: Not hard at all  Food Insecurity: No Food Insecurity (01/24/2023)   Received from [facility]   Epic    Within the past 12 months, you worried that your food would run out before you got the money to buy more.: Never true    Within the past 12 months, the food you bought just didn't last and you didn't have money to get more.: Never true  Transportation Needs: No Transportation Needs (01/24/2023)   Received from [facility] - Transportation    Lack of Transportation (Medical): No    Lack of Transportation (Non-Medical): No  Physical Activity: Insufficiently Active (01/24/2023)   Received from [facility]   Exercise Vital Sign    On average, how many days per week do you engage in moderate to strenuous exercise (like a brisk walk)?: 1 day    On average, how many minutes do you engage in exercise at this level?: 30 min  Stress: No Stress Concern Present (01/24/2023)   Received from [facility] - Occupational Stress Questionnaire    Feeling of Stress :  Only a little  Social Connections: Moderately Integrated (01/24/2023)   Received from [facility]   Social Network    How would you rate your social network (family, work, friends)?: Adequate participation with social networks  Depression (PHQ2-9): Not on file  Alcohol Screen: Not on file  Housing: Unknown (01/24/2023)   Received from [facility]    In the last 12 months, was there a time when you were not able to pay the mortgage or rent on time?: No    Number of Times Moved in the Last Year: Not on file    At any time in the past 12 months, were you homeless or living in a shelter (including now)?: No  Utilities: Not At Risk (01/24/2023)   Received from [facility]  Utilities    Threatened with loss of utilities: No  Health Literacy: Not on file   Allergies[1] Family History  Problem Relation Age of Onset   Hypertension Mother    Heart murmur Mother    Diabetes Father     Current Outpatient Medications (Analgesics):    meloxicam  (MOBIC ) 7.5 MG tablet, Take 1 tablet (7.5 mg total) by mouth daily.  Current Outpatient Medications (Hematological):    ferrous sulfate 325 (65 FE) MG tablet, Take by mouth.  Current Outpatient Medications (Other):    Cholecalciferol (D3-1000) 25 MCG (1000 UT) capsule,    gabapentin  (NEURONTIN ) 100 MG capsule, Take 1 capsule (100 mg total) by mouth at bedtime.   traZODone  (DESYREL ) 50 MG tablet, Take 0.5-1 tablets (25-50 mg total) by mouth at bedtime as needed for sleep.   Reviewed prior external information including notes and imaging from  primary care provider As well as notes that were available from care everywhere and other healthcare systems.  Past medical history, social, surgical and family history all reviewed in electronic medical record.  No pertanent information unless stated regarding to the chief complaint.   Review of Systems:  No headache, visual changes, nausea, vomiting, diarrhea, constipation, dizziness, abdominal pain, skin rash, fevers, chills, night sweats, weight loss, swollen lymph nodes, body aches, joint swelling, chest pain, shortness of breath, mood changes. POSITIVE muscle aches  Objective  Blood pressure (!) 132/90, pulse 82, height 5' 1 (1.549 m), weight 130 lb (59 kg), SpO2 97%.   General: No apparent distress alert and oriented x3 mood and affect normal, dressed appropriately.  HEENT: Pupils equal, extraocular movements intact  Respiratory: Patient's speak in full sentences and does not appear short of breath  Cardiovascular: No lower extremity edema, non tender, no erythema  Right hip exam shows patient still has some very mild decrease in internal range of motion but worsening  pain with external range of motion.  Pain with resisted adduction also noted.   Osteopathic findings  T9 extended rotated and side bent left L2 flexed rotated and side bent right Sacrum right on right    Impression and Recommendations:  Right hip pain Continue to have some discomfort.  Worsening pain with external range of motion of the hip at this time.  Discussed with patient about the possibility of the thigh compression sleeve to be helpful.  Discussed avoiding certain high impact exercises.  Discussed which activities to do and which ones to avoid.  Follow-up again in 6 to 12 weeks.    Decision today to treat with OMT was based on Physical Exam  After verbal consent patient was treated with HVLA, ME, FPR techniques in  thoracic, lumbar  and sacral areas, all areas are chronic   Patient tolerated the procedure well with improvement in symptoms  Patient given exercises, stretches and lifestyle modifications  See medications in patient instructions if given  Patient will follow up in 4-8 weeks  The above documentation has been reviewed and is accurate and complete Seirra Kos M Lorilee Cafarella, DO        [1] No Known Allergies  "

## 2024-03-26 ENCOUNTER — Encounter: Payer: Self-pay | Admitting: Family Medicine

## 2024-03-26 ENCOUNTER — Ambulatory Visit: Admitting: Family Medicine

## 2024-03-26 VITALS — BP 132/90 | HR 82 | Ht 61.0 in | Wt 130.0 lb

## 2024-03-26 DIAGNOSIS — M9903 Segmental and somatic dysfunction of lumbar region: Secondary | ICD-10-CM

## 2024-03-26 DIAGNOSIS — M9902 Segmental and somatic dysfunction of thoracic region: Secondary | ICD-10-CM | POA: Diagnosis not present

## 2024-03-26 DIAGNOSIS — M9904 Segmental and somatic dysfunction of sacral region: Secondary | ICD-10-CM

## 2024-03-26 DIAGNOSIS — M25551 Pain in right hip: Secondary | ICD-10-CM

## 2024-03-26 NOTE — Patient Instructions (Signed)
 Good to see you! Thigh compression sleeve with a lot of activity See you again in 2-3 months

## 2024-03-26 NOTE — Assessment & Plan Note (Addendum)
 Continue to have some discomfort.  Worsening pain with external range of motion of the hip at this time.  Discussed with patient about the possibility of the thigh compression sleeve to be helpful.  Discussed avoiding certain high impact exercises.  Discussed which activities to do and which ones to avoid.  Follow-up again in 6 to 12 weeks.

## 2024-06-11 ENCOUNTER — Ambulatory Visit: Admitting: Family Medicine
